# Patient Record
Sex: Male | Born: 1990 | Race: Black or African American | Hispanic: No | Marital: Married | Smoking: Never smoker
Health system: Southern US, Community
[De-identification: ages and names within clinical notes are randomized; demographics above are authoritative.]

---
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
## 2010-07-14 ENCOUNTER — Encounter: Payer: Self-pay | Admitting: Family Medicine

## 2016-11-02 DIAGNOSIS — K644 Residual hemorrhoidal skin tags: Secondary | ICD-10-CM | POA: Diagnosis not present

## 2016-11-06 DIAGNOSIS — K644 Residual hemorrhoidal skin tags: Secondary | ICD-10-CM | POA: Diagnosis not present

## 2017-04-22 DIAGNOSIS — R509 Fever, unspecified: Secondary | ICD-10-CM | POA: Diagnosis not present

## 2017-04-22 DIAGNOSIS — B349 Viral infection, unspecified: Secondary | ICD-10-CM | POA: Diagnosis not present

## 2017-08-05 DIAGNOSIS — J069 Acute upper respiratory infection, unspecified: Secondary | ICD-10-CM | POA: Diagnosis not present

## 2017-08-05 DIAGNOSIS — B9789 Other viral agents as the cause of diseases classified elsewhere: Secondary | ICD-10-CM | POA: Diagnosis not present

## 2020-06-22 ENCOUNTER — Emergency Department (HOSPITAL_BASED_OUTPATIENT_CLINIC_OR_DEPARTMENT_OTHER)
Admission: EM | Admit: 2020-06-22 | Discharge: 2020-06-23 | Disposition: A | Discharge status: Home / Self Care | Payer: BC Managed Care – PPO | Attending: Emergency Medicine | Admitting: Emergency Medicine

## 2020-06-22 ENCOUNTER — Emergency Department (HOSPITAL_BASED_OUTPATIENT_CLINIC_OR_DEPARTMENT_OTHER): Payer: BC Managed Care – PPO

## 2020-06-22 ENCOUNTER — Other Ambulatory Visit: Payer: Self-pay

## 2020-06-22 ENCOUNTER — Encounter (HOSPITAL_BASED_OUTPATIENT_CLINIC_OR_DEPARTMENT_OTHER): Payer: Self-pay | Admitting: *Deleted

## 2020-06-22 DIAGNOSIS — I880 Nonspecific mesenteric lymphadenitis: Secondary | ICD-10-CM | POA: Diagnosis not present

## 2020-06-22 DIAGNOSIS — R6883 Chills (without fever): Secondary | ICD-10-CM

## 2020-06-22 DIAGNOSIS — U071 COVID-19: Secondary | ICD-10-CM | POA: Diagnosis not present

## 2020-06-22 DIAGNOSIS — R1033 Periumbilical pain: Secondary | ICD-10-CM | POA: Diagnosis present

## 2020-06-22 LAB — COMPREHENSIVE METABOLIC PANEL
ALT: 15 U/L (ref 0–44)
AST: 16 U/L (ref 15–41)
Albumin: 4.1 g/dL (ref 3.5–5.0)
Alkaline Phosphatase: 43 U/L (ref 38–126)
Anion gap: 8 (ref 5–15)
BUN: 12 mg/dL (ref 6–20)
CO2: 24 mmol/L (ref 22–32)
Calcium: 8.7 mg/dL — ABNORMAL LOW (ref 8.9–10.3)
Chloride: 102 mmol/L (ref 98–111)
Creatinine, Ser: 1.07 mg/dL (ref 0.61–1.24)
GFR, Estimated: >60 mL/min (ref >60)
Glucose, Bld: 104 mg/dL — ABNORMAL HIGH (ref 70–99)
Potassium: 3.8 mmol/L (ref 3.5–5.1)
Sodium: 134 mmol/L — ABNORMAL LOW (ref 135–145)
Total Bilirubin: 0.6 mg/dL (ref 0.3–1.2)
Total Protein: 7.8 g/dL (ref 6.5–8.1)

## 2020-06-22 LAB — CBC WITH DIFFERENTIAL/PLATELET
Abs Immature Granulocytes: 0.02 10*3/uL (ref 0.00–0.07)
Basophils Absolute: 0 10*3/uL (ref 0.0–0.1)
Basophils Relative: 0 %
Eosinophils Absolute: 0 10*3/uL (ref 0.0–0.5)
Eosinophils Relative: 0 %
HCT: 42.1 % (ref 39.0–52.0)
Hemoglobin: 14.5 g/dL (ref 13.0–17.0)
Immature Granulocytes: 0 %
Lymphocytes Relative: 23 %
Lymphs Abs: 1.5 10*3/uL (ref 0.7–4.0)
MCH: 29.5 pg (ref 26.0–34.0)
MCHC: 34.4 g/dL (ref 30.0–36.0)
MCV: 85.6 fL (ref 80.0–100.0)
Monocytes Absolute: 0.8 10*3/uL (ref 0.1–1.0)
Monocytes Relative: 11 %
Neutro Abs: 4.3 10*3/uL (ref 1.7–7.7)
Neutrophils Relative %: 66 %
Platelets: 231 10*3/uL (ref 150–400)
RBC: 4.92 MIL/uL (ref 4.22–5.81)
RDW: 12 % (ref 11.5–15.5)
WBC: 6.7 10*3/uL (ref 4.0–10.5)
nRBC: 0 % (ref 0.0–0.2)

## 2020-06-22 LAB — RAPID INFLUENZA A&B ANTIGENS
Influenza A (ARMC): NEGATIVE
Influenza B (ARMC): NEGATIVE

## 2020-06-22 LAB — LIPASE, BLOOD: Lipase: 21 U/L (ref 11–51)

## 2020-06-22 MED ORDER — IOHEXOL 300 MG/ML  SOLN
100.0000 mL | Freq: Once | INTRAMUSCULAR | Status: AC | PRN
  Administered 2020-06-22: 100 mL via INTRAVENOUS

## 2020-06-22 MED ORDER — LACTATED RINGERS IV BOLUS
1000.0000 mL | Freq: Once | INTRAVENOUS | Status: AC
  Administered 2020-06-22: 1000 mL via INTRAVENOUS

## 2020-06-22 NOTE — ED Provider Notes (Signed)
[facility] EMERGENCY DEPARTMENT Provider Note   CSN: [id number] Arrival date & time: 06/22/20  1906     History Chief Complaint  Patient presents with  . Abdominal Pain    Nicholas Blair is a 29 y.o. male.  HPI      Nicholas Blair is a 29 y.o. male, patient with no noted past medical history, presenting to the ED with abdominal pain for about the last 3 days. Pain is sharp located periumbilical and in the lower abdomen, nonradiating, worse with movement, currently mild. 2 days ago, he additionally began to experience chills and subjective fever.  Denies vomiting, diarrhea, hematochezia/melena, chest pain, cough, shortness of breath, urinary symptoms, genital pain/swelling, or any other complaints.  History reviewed. No pertinent past medical history.  There are no problems to display for this patient.   History reviewed. No pertinent surgical history.     No family history on file.  Social History   Tobacco Use  . Smoking status: Never Smoker  . Smokeless tobacco: Never Used  Vaping Use  . Vaping Use: Never used  Substance Use Topics  . Alcohol use: Yes  . Drug use: Never    Home Medications Prior to Admission medications   Medication Sig Start Date End Date Taking? Authorizing Provider  dicyclomine (BENTYL) 20 MG tablet Take 1 tablet (20 mg total) by mouth 2 (two) times daily. 06/23/20  Yes Leaf [employer] C, PA-C    Allergies    Patient has no known allergies.  Review of Systems   Review of Systems  Constitutional: Positive for chills and fever.  Respiratory: Negative for cough and shortness of breath.   Cardiovascular: Negative for chest pain.  Gastrointestinal: Positive for abdominal pain. Negative for blood in stool, diarrhea, nausea and vomiting.  Genitourinary: Negative for dysuria, flank pain and hematuria.  Musculoskeletal: Negative for back pain.  Neurological: Negative for syncope and weakness.  All other systems reviewed and are  negative.   Physical Exam Updated Vital Signs BP 110/78 (BP Location: Right Arm)   Pulse 62   Temp 99.2 F (37.3 C) (Oral)   Resp 19   Ht 6\' 4"  (1.93 m)   Wt 99.8 kg   SpO2 98%   BMI 26.78 kg/m   Physical Exam Vitals and nursing note reviewed.  Constitutional:      General: He is not in acute distress.    Appearance: He is well-developed. He is not diaphoretic.  HENT:     Head: Normocephalic and atraumatic.     Mouth/Throat:     Mouth: Mucous membranes are moist.     Pharynx: Oropharynx is clear.  Eyes:     Conjunctiva/sclera: Conjunctivae normal.  Cardiovascular:     Rate and Rhythm: Normal rate and regular rhythm.     Pulses: Normal pulses.          Radial pulses are 2+ on the right side and 2+ on the left side.     Heart sounds: Normal heart sounds.  Pulmonary:     Effort: Pulmonary effort is normal. No respiratory distress.     Breath sounds: Normal breath sounds.  Abdominal:     Palpations: Abdomen is soft.     Tenderness: There is abdominal tenderness. There is no guarding.    Musculoskeletal:     Cervical back: Neck supple.  Lymphadenopathy:     Cervical: No cervical adenopathy.  Skin:    General: Skin is warm and dry.  Neurological:     Mental  Status: He is alert.  Psychiatric:        Mood and Affect: Mood and affect normal.        Speech: Speech normal.        Behavior: Behavior normal.     ED Results / Procedures / Treatments   Labs (all labs ordered are listed, but only abnormal results are displayed) Labs Reviewed  COMPREHENSIVE METABOLIC PANEL - Abnormal; Notable for the following components:      Result Value   Sodium 134 (*)    Glucose, Bld 104 (*)    Calcium 8.7 (*)    All other components within normal limits  RAPID INFLUENZA A&B ANTIGENS  SARS CORONAVIRUS 2 (TAT 6-24 HRS)  LIPASE, BLOOD  CBC WITH DIFFERENTIAL/PLATELET  URINALYSIS, ROUTINE W REFLEX MICROSCOPIC    EKG None  Radiology CT ABDOMEN PELVIS W CONTRAST  Result  Date: 06/22/2020 CLINICAL DATA:  Right lower quadrant abdominal pain, fever, and chills. EXAM: CT ABDOMEN AND PELVIS WITH CONTRAST TECHNIQUE: Multidetector CT imaging of the abdomen and pelvis was performed using the standard protocol following bolus administration of intravenous contrast. CONTRAST:  OMNIPAQUE IOHEXOL 300 MG/ML  SOLN COMPARISON:  None. FINDINGS: Lower chest: Small focal area of pleural thickening along the left posterior costophrenic angle. Lung bases are otherwise clear. Hepatobiliary: No focal liver abnormality is seen. No gallstones, gallbladder wall thickening, or biliary dilatation. Pancreas: Unremarkable. No pancreatic ductal dilatation or surrounding inflammatory changes. Spleen: Normal in size without focal abnormality. Adrenals/Urinary Tract: Adrenal glands are unremarkable. Kidneys are normal, without renal calculi, focal lesion, or hydronephrosis. Bladder is unremarkable. Stomach/Bowel: The stomach, small bowel, and colon are not abnormally distended. No wall thickening or inflammatory changes are appreciated. The appendix is normal. There are prominent lymph nodes in the right lower quadrant, measuring up to about 13 mm in short axis dimension. These are nonspecific but probably reactive. Changes may reflect mesenteric adenitis. Vascular/Lymphatic: No significant vascular findings are present. No enlarged abdominal or pelvic lymph nodes. Reproductive: Prostate is unremarkable. Other: No abdominal wall hernia or abnormality. No abdominopelvic ascites. Musculoskeletal: No acute or significant osseous findings. IMPRESSION: 1. Normal appendix. 2. Prominent lymph nodes in the right lower quadrant are nonspecific but probably reactive. Changes may reflect mesenteric adenitis. 3. No evidence of bowel obstruction or inflammation. Electronically Signed   By: Burman Nieves M.D.   On: 06/22/2020 22:51    Procedures Procedures (including critical care time)  Medications Ordered in  ED Medications  lactated ringers bolus 1,000 mL ( Intravenous Stopped 06/23/20 0012)  iohexol (OMNIPAQUE) 300 MG/ML solution 100 mL (100 mLs Intravenous Contrast Given 06/22/20 2239)    ED Course  I have reviewed the triage vital signs and the nursing notes.  Pertinent labs & imaging results that were available during my care of the patient were reviewed by me and considered in my medical decision making (see chart for details).  Clinical Course as of 06/23/20 0039  Fri Jun 22, 2020  2212 BP: 99/62 RN reports patient had a vagal episode during IV start.  He recovered without loss of consciousness.  Pulse rate and blood pressure also recovered. [SJ]    Clinical Course User Index [SJ] Quinn Quam C, PA-C   MDM Rules/Calculators/A&P                          Patient presents with a couple days of abdominal discomfort, fever, and chills. Patient is nontoxic appearing, afebrile, not tachycardic,  not tachypneic, not hypotensive, maintains excellent SPO2 on room air, and is in no apparent distress.   I personally reviewed and interpreted the patient's labs and imaging studies. No leukocytosis. Prominent lymph nodes on CT, possible indication of mesenteric adenitis. The patient was given instructions for home care as well as return precautions. Patient voices understanding of these instructions, accepts the plan, and is comfortable with discharge.    Final Clinical Impression(s) / ED Diagnoses Final diagnoses:  Mesenteric adenitis  Chills    Rx / DC Orders ED Discharge Orders         Ordered    dicyclomine (BENTYL) 20 MG tablet  2 times daily        06/23/20 0000           Concepcion Living 06/23/20 0041    Cheryll Cockayne, MD 06/23/20 2330

## 2020-06-22 NOTE — ED Notes (Signed)
Pt also endorses cough with congestion with fever

## 2020-06-22 NOTE — ED Notes (Signed)
Pt vagaled when IV started. V/s monitored

## 2020-06-22 NOTE — ED Triage Notes (Signed)
Abdominal pain, low grade fever and chills this week. Pt reports he has had 2 negative home covid tests this week

## 2020-06-23 LAB — URINALYSIS, ROUTINE W REFLEX MICROSCOPIC
Glucose, UA: NEGATIVE mg/dL
Ketones, ur: NEGATIVE mg/dL
Leukocytes,Ua: NEGATIVE
Nitrite: POSITIVE — AB
Protein, ur: NEGATIVE mg/dL
Specific Gravity, Urine: 1.01 (ref 1.005–1.030)
pH: 6.5 (ref 5.0–8.0)

## 2020-06-23 LAB — SARS CORONAVIRUS 2 (TAT 6-24 HRS): SARS Coronavirus 2: POSITIVE — AB

## 2020-06-23 LAB — URINALYSIS, MICROSCOPIC (REFLEX)

## 2020-06-23 MED ORDER — DICYCLOMINE HCL 20 MG PO TABS: 20.0000 mg | ORAL_TABLET | Freq: Two times a day (BID) | ORAL | 0 refills | Status: AC

## 2020-06-23 NOTE — Discharge Instructions (Signed)
  Hydration: Symptoms of most illnesses will be intensified and complicated by dehydration. Dehydration can also extend the duration of symptoms. Drink plenty of fluids and get plenty of rest. You should be drinking at least half a liter of water an hour to stay hydrated. Electrolyte drinks (ex. Gatorade, Powerade, Pedialyte) are also encouraged. You should be drinking enough fluids to make your urine light yellow, almost clear. If this is not the case, you are not drinking enough water. Please note that some of the treatments indicated below will not be effective if you are not adequately hydrated. Antiinflammatory medications: Take 600 mg of ibuprofen every 6 hours or 440 mg (over the counter dose) to 500 mg (prescription dose) of naproxen every 12 hours for the next 3 days. After this time, these medications may be used as needed for pain. Take these medications with food to avoid upset stomach. Choose only one of these medications, do not take them together. Acetaminophen (generic for Tylenol): Should you continue to have additional pain while taking the ibuprofen or naproxen, you may add in acetaminophen as needed. Your daily total maximum amount of acetaminophen from all sources should be limited to 4000mg /day for persons without liver problems, or 2000mg /day for those with liver problems.  Dicyclomine: Dicyclomine (generic for Bentyl) is what is known as an antispasmodic and is intended to help reduce abdominal discomfort.  Test Results for COVID-19 pending  You have a test pending for COVID-19.  Results typically return within about 48 hours.  Be sure to check MyChart for updated results.  We recommend isolating yourself until results are received.  Patients who have symptoms consistent with COVID-19 should self isolated for: At least 3 days (72 hours) have passed since recovery, defined as resolution of fever without the use of fever reducing medications and improvement in respiratory symptoms  (e.g., cough, shortness of breath), and At least 7 days have passed since symptoms first appeared.  If you have no symptoms, but your test returns positive, recommend isolating for at least 10 days.

## 2022-02-04 IMAGING — CT CT ABD-PELV W/ CM
2 of 4 series · 16 of 46 positions shown, 18 images · IV contrast (Omnipaque)
Comparison: None.

CLINICAL DATA: Right lower quadrant abdominal pain, fever, and
chills.

EXAM:
CT ABDOMEN AND PELVIS WITH CONTRAST
TECHNIQUE: Multidetector CT imaging of the abdomen and pelvis was performed
using the standard protocol following bolus administration of
intravenous contrast.
CONTRAST:  100mL OMNIPAQUE IOHEXOL 300 MG/ML  SOLN

[Series 2: axial st · axial · 0.79mm/px · z∈[-401,+24]mm · 13 of 95 slices shown, 15 images]
[im 5/95  soft-tissue]
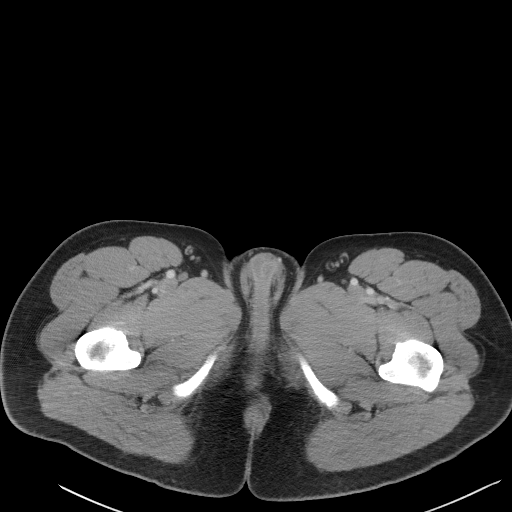
[im 5/95  bone]
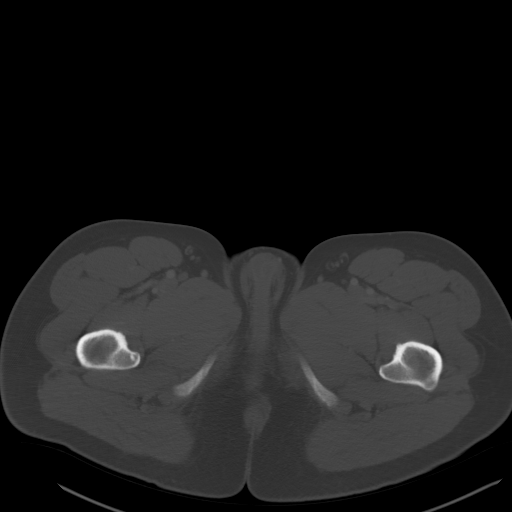
[im 13/95  soft-tissue]
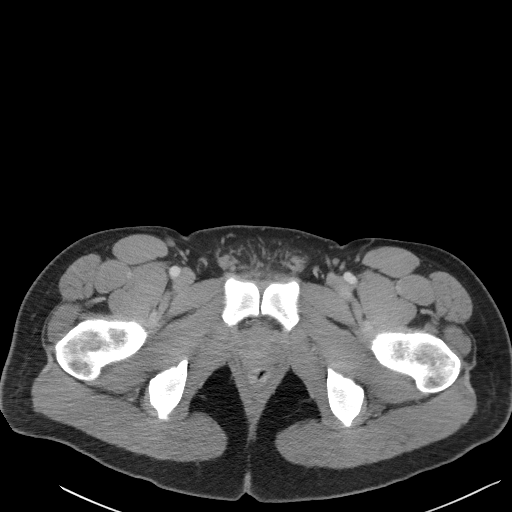
[im 22/95  soft-tissue]
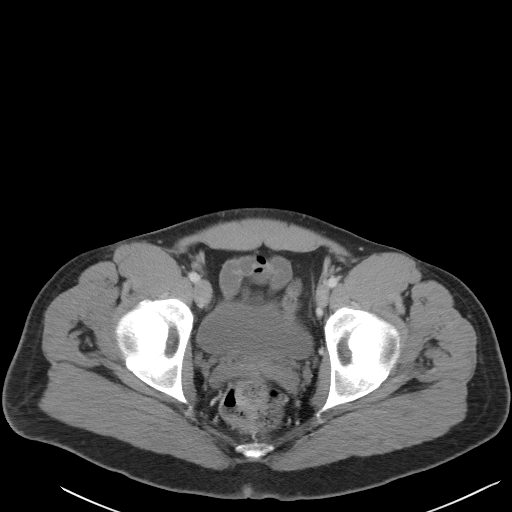
[im 26/95  soft-tissue]
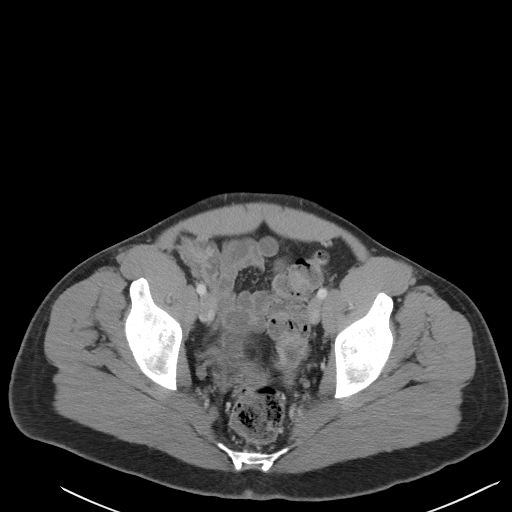
[im 35/95  soft-tissue]
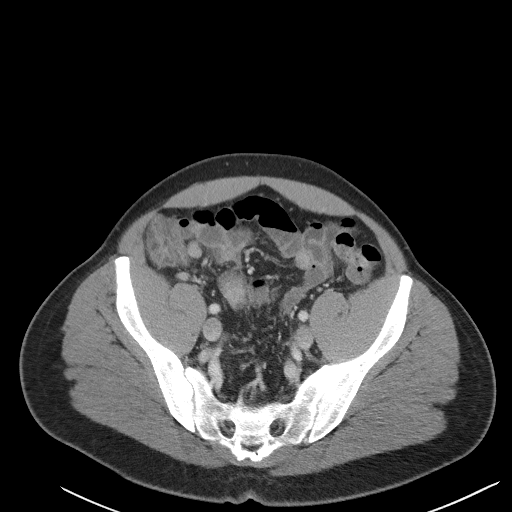
[im 39/95  soft-tissue]
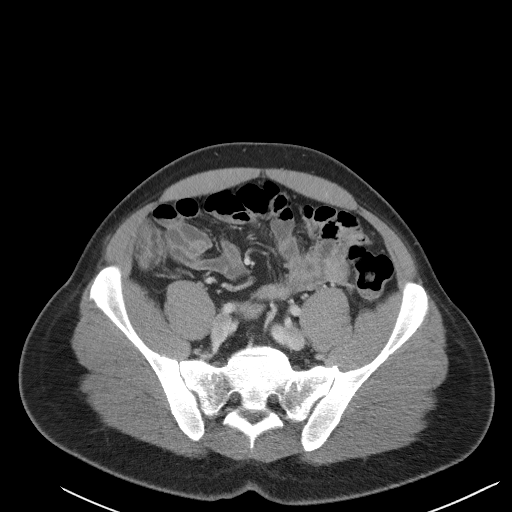
[im 48/95  soft-tissue]
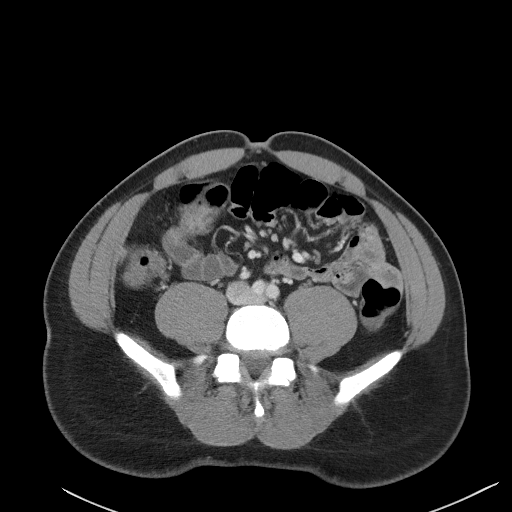
[im 56/95  soft-tissue]
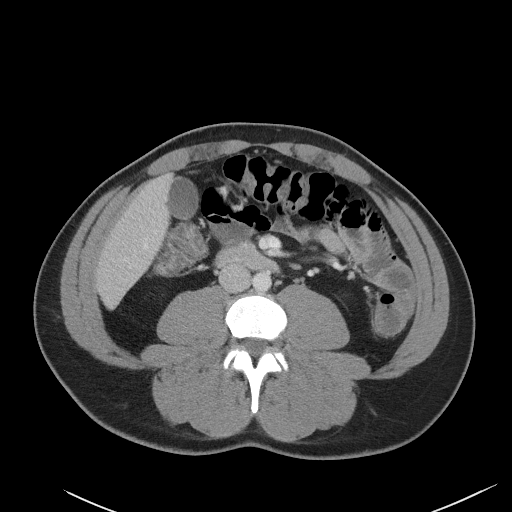
[im 60/95  soft-tissue]
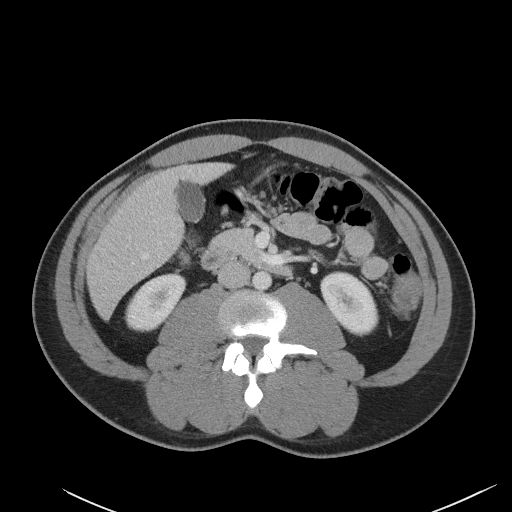
[im 60/95  bone]
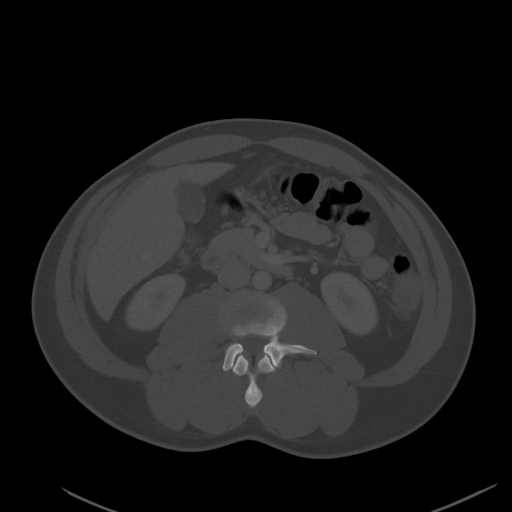
[im 69/95  soft-tissue]
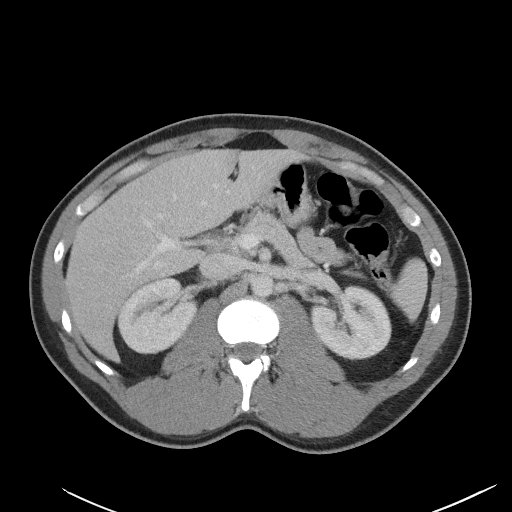
[im 73/95  soft-tissue]
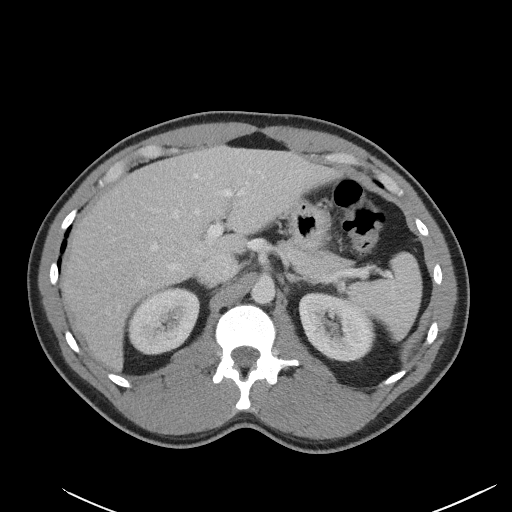
[im 82/95  soft-tissue]
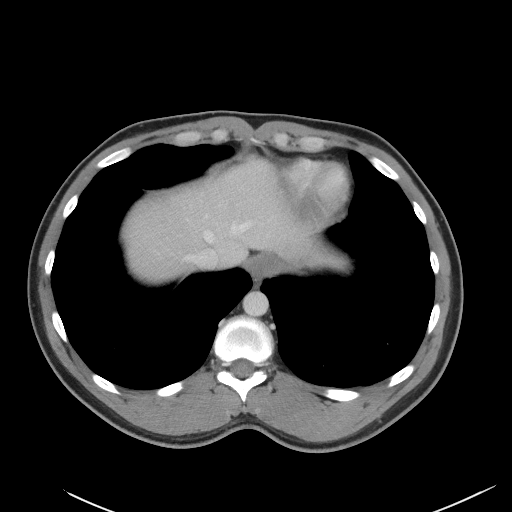
[im 90/95  soft-tissue]
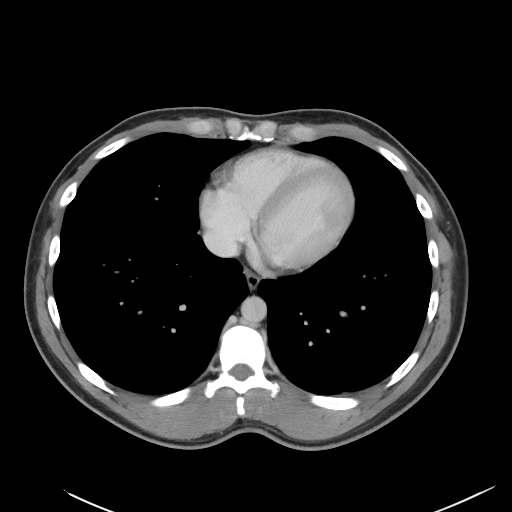

[Series 5: coronal st · coronal · 0.74mm/px · 3 of 94 slices shown]
[im 32/94  soft-tissue]
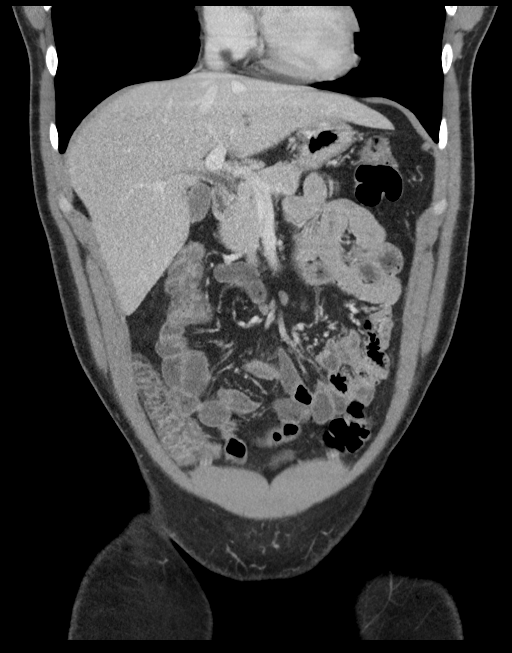
[im 42/94  soft-tissue]
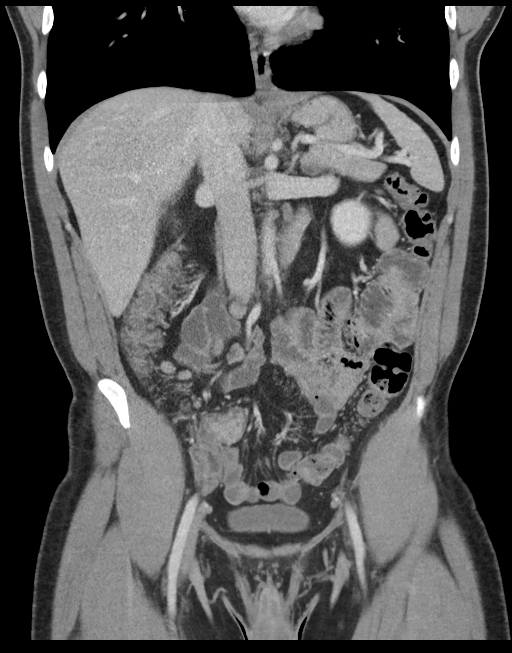
[im 52/94  soft-tissue]
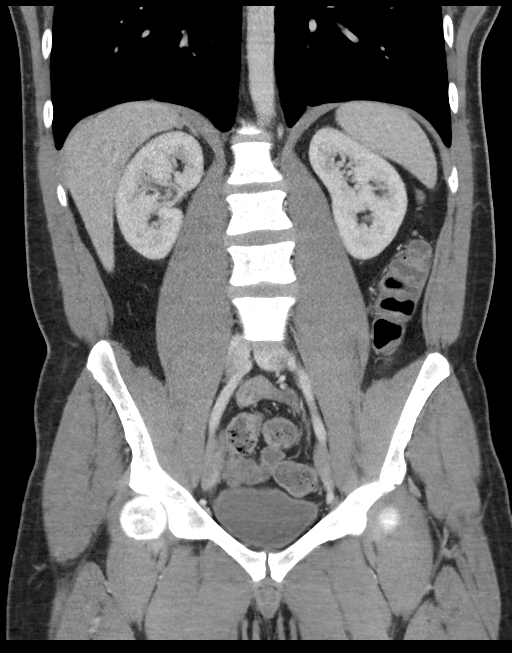

[16 of 46 positions shown; findings below may reference images not displayed]

FINDINGS: Lower chest: Small focal area of pleural thickening along the left
posterior costophrenic angle. Lung bases are otherwise clear.

Hepatobiliary: No focal liver abnormality is seen. No gallstones,
gallbladder wall thickening, or biliary dilatation.

Pancreas: Unremarkable. No pancreatic ductal dilatation or
surrounding inflammatory changes.

Spleen: Normal in size without focal abnormality.

Adrenals/Urinary Tract: Adrenal glands are unremarkable. Kidneys are
normal, without renal calculi, focal lesion, or hydronephrosis.
Bladder is unremarkable.

Stomach/Bowel: The stomach, small bowel, and colon are not
abnormally distended. No wall thickening or inflammatory changes are
appreciated. The appendix is normal. There are prominent lymph nodes
in the right lower quadrant, measuring up to about 13 mm in short
axis dimension. These are nonspecific but probably reactive. Changes
may reflect mesenteric adenitis.

Vascular/Lymphatic: No significant vascular findings are present. No
enlarged abdominal or pelvic lymph nodes.

Reproductive: Prostate is unremarkable.

Other: No abdominal wall hernia or abnormality. No abdominopelvic
ascites.

Musculoskeletal: No acute or significant osseous findings.
IMPRESSION: 1. Normal appendix.
2. Prominent lymph nodes in the right lower quadrant are nonspecific
but probably reactive. Changes may reflect mesenteric adenitis.
3. No evidence of bowel obstruction or inflammation.

## 2022-11-07 DIAGNOSIS — L309 Dermatitis, unspecified: Secondary | ICD-10-CM | POA: Diagnosis not present

## 2022-11-07 DIAGNOSIS — Z0000 Encounter for general adult medical examination without abnormal findings: Secondary | ICD-10-CM | POA: Diagnosis not present

## 2022-11-07 DIAGNOSIS — Z23 Encounter for immunization: Secondary | ICD-10-CM | POA: Diagnosis not present

## 2022-11-07 DIAGNOSIS — Z13220 Encounter for screening for lipoid disorders: Secondary | ICD-10-CM | POA: Diagnosis not present

## 2023-07-23 DIAGNOSIS — R051 Acute cough: Secondary | ICD-10-CM | POA: Diagnosis not present

## 2023-07-23 DIAGNOSIS — J069 Acute upper respiratory infection, unspecified: Secondary | ICD-10-CM | POA: Diagnosis not present

## 2023-07-23 DIAGNOSIS — J029 Acute pharyngitis, unspecified: Secondary | ICD-10-CM | POA: Diagnosis not present

## 2023-07-23 DIAGNOSIS — B349 Viral infection, unspecified: Secondary | ICD-10-CM | POA: Diagnosis not present

## 2023-11-11 DIAGNOSIS — H6992 Unspecified Eustachian tube disorder, left ear: Secondary | ICD-10-CM | POA: Diagnosis not present

## 2023-11-11 DIAGNOSIS — Z0000 Encounter for general adult medical examination without abnormal findings: Secondary | ICD-10-CM | POA: Diagnosis not present

## 2023-11-11 DIAGNOSIS — Z13220 Encounter for screening for lipoid disorders: Secondary | ICD-10-CM | POA: Diagnosis not present

## 2023-11-11 DIAGNOSIS — L249 Irritant contact dermatitis, unspecified cause: Secondary | ICD-10-CM | POA: Diagnosis not present
